# Patient Record
Sex: Female | Born: 1970 | Race: Asian | Hispanic: No | Marital: Married | Smoking: Never smoker
Health system: Southern US, Community
[De-identification: ages and names within clinical notes are randomized; demographics above are authoritative.]

---

## 2017-12-15 ENCOUNTER — Ambulatory Visit: Payer: 59 | Admitting: Family Medicine

## 2017-12-18 ENCOUNTER — Ambulatory Visit (INDEPENDENT_AMBULATORY_CARE_PROVIDER_SITE_OTHER): Payer: 59 | Admitting: Family Medicine

## 2017-12-18 ENCOUNTER — Encounter: Payer: Self-pay | Admitting: Family Medicine

## 2017-12-18 ENCOUNTER — Other Ambulatory Visit: Payer: Self-pay

## 2017-12-18 VITALS — BP 127/81 | HR 102 | Temp 98.5°F | Resp 16 | Ht 62.0 in | Wt 130.4 lb

## 2017-12-18 DIAGNOSIS — Z0000 Encounter for general adult medical examination without abnormal findings: Secondary | ICD-10-CM | POA: Diagnosis not present

## 2017-12-18 DIAGNOSIS — Z1329 Encounter for screening for other suspected endocrine disorder: Secondary | ICD-10-CM

## 2017-12-18 DIAGNOSIS — Z833 Family history of diabetes mellitus: Secondary | ICD-10-CM | POA: Diagnosis not present

## 2017-12-18 DIAGNOSIS — Z13220 Encounter for screening for lipoid disorders: Secondary | ICD-10-CM

## 2017-12-18 DIAGNOSIS — Z1239 Encounter for other screening for malignant neoplasm of breast: Secondary | ICD-10-CM

## 2017-12-18 DIAGNOSIS — Z1231 Encounter for screening mammogram for malignant neoplasm of breast: Secondary | ICD-10-CM

## 2017-12-18 DIAGNOSIS — Z131 Encounter for screening for diabetes mellitus: Secondary | ICD-10-CM

## 2017-12-18 DIAGNOSIS — Z780 Asymptomatic menopausal state: Secondary | ICD-10-CM

## 2017-12-18 NOTE — Progress Notes (Signed)
Chief Complaint  Patient presents with  . New Patient (Initial Visit)    establish care. No concerns per patient.    Subjective:  Deborah Herman is a 47 y.o. female here for a health maintenance visit.  Patient is new pt  Patient Active Problem List   Diagnosis Date Noted  . Menopause 12/18/2017    History reviewed. No pertinent past medical history.  History reviewed. No pertinent surgical history.   No outpatient medications prior to visit.   No facility-administered medications prior to visit.     No Known Allergies   Family History  Problem Relation Age of Onset  . Diabetes Father   . Heart disease Father      Health Habits: Dental Exam: up to date Eye Exam: not up to date Exercise:4 times/week on average Current exercise activities: walking Diet: balanced   Social History   Socioeconomic History  . Marital status: Married    Spouse name: Not on file  . Number of children: Not on file  . Years of education: Not on file  . Highest education level: Not on file  Occupational History  . Not on file  Social Needs  . Financial resource strain: Not on file  . Food insecurity:    Worry: Not on file    Inability: Not on file  . Transportation needs:    Medical: Not on file    Non-medical: Not on file  Tobacco Use  . Smoking status: Never Smoker  . Smokeless tobacco: Never Used  Substance and Sexual Activity  . Alcohol use: Never    Frequency: Never  . Drug use: Never  . Sexual activity: Never  Lifestyle  . Physical activity:    Days per week: Not on file    Minutes per session: Not on file  . Stress: Not on file  Relationships  . Social connections:    Talks on phone: Not on file    Gets together: Not on file    Attends religious service: Not on file    Active member of club or organization: Not on file    Attends meetings of clubs or organizations: Not on file    Relationship status: Not on file  . Intimate partner violence:    Fear of  current or ex partner: Not on file    Emotionally abused: Not on file    Physically abused: Not on file    Forced sexual activity: Not on file  Other Topics Concern  . Not on file  Social History Narrative  . Not on file   Social History   Substance and Sexual Activity  Alcohol Use Never  . Frequency: Never   Social History   Tobacco Use  Smoking Status Never Smoker  Smokeless Tobacco Never Used   Social History   Substance and Sexual Activity  Drug Use Never    GYN: Sexual Health Menstrual status: menopausal [id number] LMP: No LMP recorded. Patient is postmenopausal. Last pap smear: see HM section History of abnormal pap smears:  Sexually active: with female partner Current contraception: tubal ligation   Health Maintenance: See under health Maintenance activity for review of completion dates as well.  There is no immunization history on file for this patient.    Depression Screen-PHQ2/9 Depression screen [facility] 2/9 12/18/2017  Decreased Interest 0  Down, Depressed, Hopeless 0  PHQ - 2 Score 0       Depression Severity and Treatment Recommendations:  0-4= None  5-9= Mild / Treatment:  Support, educate to call if worse; return in one month  10-14= Moderate / Treatment: Support, watchful waiting; Antidepressant or Psycotherapy  15-19= Moderately severe / Treatment: Antidepressant OR Psychotherapy  >= 20 = Major depression, severe / Antidepressant AND Psychotherapy    Review of Systems   Review of Systems  Constitutional: Negative for chills, fever and weight loss.  HENT: Negative for congestion, ear discharge, ear pain, nosebleeds and tinnitus.   Eyes: Negative for blurred vision and double vision.  Respiratory: Negative for cough, shortness of breath and wheezing.   Cardiovascular: Negative for chest pain and palpitations.  Gastrointestinal: Negative for abdominal pain, diarrhea, nausea and vomiting.  Genitourinary: Negative for dysuria, frequency and  urgency.  Musculoskeletal: Negative for back pain, myalgias and neck pain.  Skin: Negative for itching and rash.  Neurological: Negative for dizziness, tingling, tremors and headaches.  Psychiatric/Behavioral: Negative for depression. The patient is not nervous/anxious and does not have insomnia.     See HPI for ROS as well.    Objective:   Vitals:   12/18/17 1111  BP: 127/81  Pulse: (!) 102  Resp: 16  Temp: 98.5 F (36.9 C)  TempSrc: Oral  SpO2: 97%  Weight: 130 lb 6.4 oz (59.1 kg)  Height: 5\' 2"  (1.575 m)    Body mass index is 23.85 kg/m.  Physical Exam  Constitutional: She is oriented to person, place, and time. She appears well-developed and well-nourished.  HENT:  Head: Normocephalic and atraumatic.  Eyes: Pupils are equal, round, and reactive to light. Conjunctivae and EOM are normal.  Normal fundoscopic exam  Neck: Normal range of motion. Neck supple.  Cardiovascular: Normal rate, regular rhythm and normal heart sounds.  No murmur heard. Pulmonary/Chest: Effort normal and breath sounds normal. No stridor. No respiratory distress. She has no wheezes.  Abdominal: Soft. Bowel sounds are normal. She exhibits no distension and no mass. There is no tenderness. There is no guarding.  Musculoskeletal: Normal range of motion. She exhibits no edema.  Neurological: She is alert and oriented to person, place, and time.  Skin: Skin is warm. Capillary refill takes less than 2 seconds.  Psychiatric: She has a normal mood and affect. Her behavior is normal. Judgment and thought content normal.       Assessment/Plan:   Patient was seen for a health maintenance exam.  Counseled the patient on health maintenance issues. Reviewed her health mainteance schedule and ordered appropriate tests (see orders.) Counseled on regular exercise and weight management. Recommend regular eye exams and dental cleaning.   The following issues were addressed today for health  maintenance:   Ziare was seen today for new patient (initial visit).  Diagnoses and all orders for this visit:  Encounter for health maintenance examination in adult- discussed diabetes screening, discussed lipid screening, discussed mammogram and bone density Patient will return for pap smear  Family history of diabetes mellitus (DM) -     Lipid panel; Future -     Hemoglobin A1c; Future -     Comprehensive metabolic panel; Future  Screening, lipid -  Discussed lipid screening   Screening for thyroid disorder -     TSH; Future  Screening for diabetes mellitus- family has history of diabetes -     Hemoglobin A1c; Future -     Comprehensive metabolic panel; Future  Menopause -     DG Bone Density; Future  Screening for breast cancer -     MM Digital Screening; Future    Return for  one month for pap smear and to review mammogram.    Body mass index is 23.85 kg/m.:  Discussed the patient's BMI with patient. The BMI body mass index is 23.85 kg/m.     Future Appointments  Date Time Provider Department Center  12/22/2017  8:00 AM PCP-NURSE PCP-PCP [facility]  01/19/2018  8:40 AM Doristine BosworthStallings, Yariela Tison A, MD PCP-PCP [facility]    Patient Instructions   We recommend that you schedule a mammogram for breast cancer screening. Typically, you do not need a referral to do this. Please contact a local imaging center to schedule your mammogram.  [facility] - [phone]  *ask for the Radiology Department [facility]) - [phone] or [phone]  [facility] - [phone] [facility] - [phone] [facility] - [phone]     IF you received an x-ray today, you will receive an invoice from [facility]. Please contact [facility] at [phone] with questions or concerns regarding your invoice.   IF you received labwork today, you will receive an invoice from [facility]. Please contact [facility] at  [phone] with questions or concerns regarding your invoice.   Our billing staff will not be able to assist you with questions regarding bills from these companies.  You will be contacted with the lab results as soon as they are available. The fastest way to get your results is to activate your My Chart account. Instructions are located on the last page of this paperwork. If you have not heard from us regarding the results in 2 weeks, please contact this office.     Bone Health Bones protect organs, store calcium, and anchor muscles. Good health habits, such as eating nutritious foods and exercising regularly, are important for maintaining healthy bones. They can also help to prevent a condition that causes bones to lose density and become weak and brittle (osteoporosis). Why is bone mass important? Bone mass refers to the amount of bone tissue that you have. The higher your bone mass, the stronger your bones. An important step toward having healthy bones throughout life is to have strong and dense bones during childhood. A young adult who has a high bone mass is more likely to have a high bone mass later in life. Bone mass at its greatest it is called peak bone mass. A large decline in bone mass occurs in older adults. In women, it occurs about the time of menopause. During this time, it is important to practice good health habits, because if more bone is lost than what is replaced, the bones will become less healthy and more likely to break (fracture). If you find that you have a low bone mass, you may be able to prevent osteoporosis or further bone loss by changing your diet and lifestyle. How can I find out if my bone mass is low? Bone mass can be measured with an X-ray test that is called a bone mineral density (BMD) test. This test is recommended for all women who are age 45 or older. It may also be recommended for men who are age 45 or older, or for people who are more likely to develop  osteoporosis due to:  Having bones that break easily.  Having a long-term disease that weakens bones, such as kidney disease or rheumatoid arthritis.  Having menopause earlier than normal.  Taking medicine that weakens bones, such as steroids, thyroid hormones, or hormone treatment for breast cancer or prostate cancer.  Smoking.  Drinking three or more alcoholic drinks each day.  What are the nutritional recommendations for healthy bones? To have healthy bones, you need to get enough of the right minerals and vitamins. Most nutrition experts recommend getting these nutrients from the foods that you eat. Nutritional recommendations vary from person to person. Ask your health care provider what is healthy for you. Here are some general guidelines. Calcium Recommendations Calcium is the most important (essential) mineral for bone health. Most people can get enough calcium from their diet, but supplements may be recommended for people who are at risk for osteoporosis. Good sources of calcium include:  Dairy products, such as low-fat or nonfat milk, cheese, and yogurt.  Dark green leafy vegetables, such as bok choy and broccoli.  Calcium-fortified foods, such as orange juice, cereal, bread, soy beverages, and tofu products.  Nuts, such as almonds.  Follow these recommended amounts for daily calcium intake:  Children, age 52?3: 700 mg.  Children, age 50?8: 1,000 mg.  Children, age 27?13: 1,300 mg.  Teens, age 524?18: 1,300 mg.  Adults, age 529?50: 1,000 mg.  Adults, age 37?70: ? Men: 1,000 mg. ? Women: 1,200 mg.  Adults, age 61 or older: 1,200 mg.  Pregnant and breastfeeding females: ? Teens: 1,300 mg. ? Adults: 1,000 mg.  Vitamin D Recommendations Vitamin D is the most essential vitamin for bone health. It helps the body to absorb calcium. Sunlight stimulates the skin to make vitamin D, so be sure to get enough sunlight. If you live in a cold climate or you do not get outside  often, your health care provider may recommend that you take vitamin D supplements. Good sources of vitamin D in your diet include:  Egg yolks.  Saltwater fish.  Milk and cereal fortified with vitamin D.  Follow these recommended amounts for daily vitamin D intake:  Children and teens, age 52?18: 600 international units.  Adults, age 32 or younger: 400-800 international units.  Adults, age 57 or older: 800-1,000 international units.  Other Nutrients Other nutrients for bone health include:  Phosphorus. This mineral is found in meat, poultry, dairy foods, nuts, and legumes. The recommended daily intake for adult men and adult women is 700 mg.  Magnesium. This mineral is found in seeds, nuts, dark green vegetables, and legumes. The recommended daily intake for adult men is 400?420 mg. For adult women, it is 310?320 mg.  Vitamin K. This vitamin is found in green leafy vegetables. The recommended daily intake is 120 mg for adult men and 90 mg for adult women.  What type of physical activity is best for building and maintaining healthy bones? Weight-bearing and strength-building activities are important for building and maintaining peak bone mass. Weight-bearing activities cause muscles and bones to work against gravity. Strength-building activities increases muscle strength that supports bones. Weight-bearing and muscle-building activities include:  Walking and hiking.  Jogging and running.  Dancing.  Gym exercises.  Lifting weights.  Tennis and racquetball.  Climbing stairs.  Aerobics.  Adults should get at least 30 minutes of moderate physical activity on most days. Children should get at least 60 minutes of moderate physical activity on most days. Ask your health care provide what type of exercise is best for you. Where can I find more information? For more information, check out the following websites:  National Osteoporosis Foundation:  http://burton-owens.org/  [employer] of [employer]: http://www.niams.http://www.johnson-fowler.biz/.asp  This information is not intended to replace advice given to you by your health care provider. Make sure you  discuss any questions you have with your health care provider. Document Released: 07/16/2003 Document Revised: 11/13/2015 Document Reviewed: 04/30/2014 Elsevier Interactive Patient Education  [employer]2018 Elsevier Inc.

## 2017-12-18 NOTE — Patient Instructions (Addendum)
We recommend that you schedule a mammogram for breast cancer screening. Typically, you do not need a referral to do this. Please contact a local imaging center to schedule your mammogram.  [facility] - [phone]  *ask for the Radiology Department [facility]) - [phone] or [phone]  [facility] - [phone] [facility] - [phone] [facility] - [phone]     IF you received an x-ray today, you will receive an invoice from [facility]. Please contact [facility] at [phone] with questions or concerns regarding your invoice.   IF you received labwork today, you will receive an invoice from [facility]. Please contact [facility] at [phone] with questions or concerns regarding your invoice.   Our billing staff will not be able to assist you with questions regarding bills from these companies.  You will be contacted with the lab results as soon as they are available. The fastest way to get your results is to activate your My Chart account. Instructions are located on the last page of this paperwork. If you have not heard from us regarding the results in 2 weeks, please contact this office.     Bone Health Bones protect organs, store calcium, and anchor muscles. Good health habits, such as eating nutritious foods and exercising regularly, are important for maintaining healthy bones. They can also help to prevent a condition that causes bones to lose density and become weak and brittle (osteoporosis). Why is bone mass important? Bone mass refers to the amount of bone tissue that you have. The higher your bone mass, the stronger your bones. An important step toward having healthy bones throughout life is to have strong and dense bones during childhood. A young adult who has a high bone mass is more likely to have a high bone mass later in life. Bone mass at its greatest it is called peak  bone mass. A large decline in bone mass occurs in older adults. In women, it occurs about the time of menopause. During this time, it is important to practice good health habits, because if more bone is lost than what is replaced, the bones will become less healthy and more likely to break (fracture). If you find that you have a low bone mass, you may be able to prevent osteoporosis or further bone loss by changing your diet and lifestyle. How can I find out if my bone mass is low? Bone mass can be measured with an X-ray test that is called a bone mineral density (BMD) test. This test is recommended for all women who are age 45 or older. It may also be recommended for men who are age 45 or older, or for people who are more likely to develop osteoporosis due to:  Having bones that break easily.  Having a long-term disease that weakens bones, such as kidney disease or rheumatoid arthritis.  Having menopause earlier than normal.  Taking medicine that weakens bones, such as steroids, thyroid hormones, or hormone treatment for breast cancer or prostate cancer.  Smoking.  Drinking three or more alcoholic drinks each day.  What are the nutritional recommendations for healthy bones? To have healthy bones, you need to get enough of the right minerals and vitamins. Most nutrition experts recommend getting these nutrients from the foods that you eat. Nutritional recommendations vary from person to person. Ask your health care provider what is healthy for you. Here are some general guidelines. Calcium Recommendations Calcium is  the most important (essential) mineral for bone health. Most people can get enough calcium from their diet, but supplements may be recommended for people who are at risk for osteoporosis. Good sources of calcium include:  Dairy products, such as low-fat or nonfat milk, cheese, and yogurt.  Dark green leafy vegetables, such as bok choy and broccoli.  Calcium-fortified foods, such  as orange juice, cereal, bread, soy beverages, and tofu products.  Nuts, such as almonds.  Follow these recommended amounts for daily calcium intake:  Children, age 26?3: 700 mg.  Children, age 70?8: 1,000 mg.  Children, age 80?13: 1,300 mg.  Teens, age 264?18: 1,300 mg.  Adults, age 269?50: 1,000 mg.  Adults, age 8?70: ? Men: 1,000 mg. ? Women: 1,200 mg.  Adults, age 26371 or older: 1,200 mg.  Pregnant and breastfeeding females: ? Teens: 1,300 mg. ? Adults: 1,000 mg.  Vitamin D Recommendations Vitamin D is the most essential vitamin for bone health. It helps the body to absorb calcium. Sunlight stimulates the skin to make vitamin D, so be sure to get enough sunlight. If you live in a cold climate or you do not get outside often, your health care provider may recommend that you take vitamin D supplements. Good sources of vitamin D in your diet include:  Egg yolks.  Saltwater fish.  Milk and cereal fortified with vitamin D.  Follow these recommended amounts for daily vitamin D intake:  Children and teens, age 26?18: 600 international units.  Adults, age 45 or younger: 400-800 international units.  Adults, age 70851 or older: 800-1,000 international units.  Other Nutrients Other nutrients for bone health include:  Phosphorus. This mineral is found in meat, poultry, dairy foods, nuts, and legumes. The recommended daily intake for adult men and adult women is 700 mg.  Magnesium. This mineral is found in seeds, nuts, dark green vegetables, and legumes. The recommended daily intake for adult men is 400?420 mg. For adult women, it is 310?320 mg.  Vitamin K. This vitamin is found in green leafy vegetables. The recommended daily intake is 120 mg for adult men and 90 mg for adult women.  What type of physical activity is best for building and maintaining healthy bones? Weight-bearing and strength-building activities are important for building and maintaining peak bone mass.  Weight-bearing activities cause muscles and bones to work against gravity. Strength-building activities increases muscle strength that supports bones. Weight-bearing and muscle-building activities include:  Walking and hiking.  Jogging and running.  Dancing.  Gym exercises.  Lifting weights.  Tennis and racquetball.  Climbing stairs.  Aerobics.  Adults should get at least 30 minutes of moderate physical activity on most days. Children should get at least 60 minutes of moderate physical activity on most days. Ask your health care provide what type of exercise is best for you. Where can I find more information? For more information, check out the following websites:  National Osteoporosis Foundation: http://burton-owens.org/http://nof.org/learn/basics  [employer] Health: http://www.niams.http://www.johnson-fowler.biz/nih.gov/Health_Info/Bone/Bone_Health/bone_health_for_life.asp  This information is not intended to replace advice given to you by your health care provider. Make sure you discuss any questions you have with your health care provider. Document Released: 07/16/2003 Document Revised: 11/13/2015 Document Reviewed: 04/30/2014 Elsevier Interactive Patient Education  [employer]2018 Elsevier Inc.

## 2017-12-22 ENCOUNTER — Ambulatory Visit (INDEPENDENT_AMBULATORY_CARE_PROVIDER_SITE_OTHER): Payer: 59 | Admitting: Emergency Medicine

## 2017-12-22 DIAGNOSIS — Z833 Family history of diabetes mellitus: Secondary | ICD-10-CM

## 2017-12-22 DIAGNOSIS — Z131 Encounter for screening for diabetes mellitus: Secondary | ICD-10-CM

## 2017-12-22 DIAGNOSIS — Z1329 Encounter for screening for other suspected endocrine disorder: Secondary | ICD-10-CM

## 2017-12-23 LAB — COMPREHENSIVE METABOLIC PANEL
A/G RATIO: 1.5 (ref 1.2–2.2)
ALBUMIN: 4.4 g/dL (ref 3.5–5.5)
ALT: 11 IU/L (ref 0–32)
AST: 14 IU/L (ref 0–40)
Alkaline Phosphatase: 58 IU/L (ref 39–117)
BILIRUBIN TOTAL: 0.2 mg/dL (ref 0.0–1.2)
BUN / CREAT RATIO: 17 (ref 9–23)
BUN: 12 mg/dL (ref 6–24)
CALCIUM: 9.8 mg/dL (ref 8.7–10.2)
CO2: 24 mmol/L (ref 20–29)
Chloride: 101 mmol/L (ref 96–106)
Creatinine, Ser: 0.7 mg/dL (ref 0.57–1.00)
GFR calc Af Amer: 121 mL/min/{1.73_m2} (ref >59)
GFR, EST NON AFRICAN AMERICAN: 105 mL/min/{1.73_m2} (ref >59)
GLOBULIN, TOTAL: 3 g/dL (ref 1.5–4.5)
Glucose: 90 mg/dL (ref 65–99)
POTASSIUM: 4 mmol/L (ref 3.5–5.2)
SODIUM: 140 mmol/L (ref 134–144)
TOTAL PROTEIN: 7.4 g/dL (ref 6.0–8.5)

## 2017-12-23 LAB — LIPID PANEL
CHOLESTEROL TOTAL: 190 mg/dL (ref 100–199)
Chol/HDL Ratio: 2.9 ratio (ref 0.0–4.4)
HDL: 65 mg/dL (ref >39)
LDL Calculated: 113 mg/dL — ABNORMAL HIGH (ref 0–99)
Triglycerides: 58 mg/dL (ref 0–149)
VLDL CHOLESTEROL CAL: 12 mg/dL (ref 5–40)

## 2017-12-23 LAB — HEMOGLOBIN A1C
Est. average glucose Bld gHb Est-mCnc: 114 mg/dL
Hgb A1c MFr Bld: 5.6 % (ref 4.8–5.6)

## 2017-12-23 LAB — TSH: TSH: 3.19 u[IU]/mL (ref 0.450–4.500)

## 2017-12-26 ENCOUNTER — Ambulatory Visit (HOSPITAL_BASED_OUTPATIENT_CLINIC_OR_DEPARTMENT_OTHER)
Admission: RE | Admit: 2017-12-26 | Discharge: 2017-12-26 | Disposition: A | Discharge status: Home / Self Care | Payer: 59 | Source: Ambulatory Visit | Attending: Family Medicine | Admitting: Family Medicine

## 2017-12-26 DIAGNOSIS — Z1231 Encounter for screening mammogram for malignant neoplasm of breast: Secondary | ICD-10-CM | POA: Diagnosis present

## 2017-12-26 DIAGNOSIS — Z780 Asymptomatic menopausal state: Secondary | ICD-10-CM | POA: Insufficient documentation

## 2017-12-26 DIAGNOSIS — Z1239 Encounter for other screening for malignant neoplasm of breast: Secondary | ICD-10-CM

## 2017-12-26 DIAGNOSIS — M85852 Other specified disorders of bone density and structure, left thigh: Secondary | ICD-10-CM | POA: Diagnosis not present

## 2017-12-26 DIAGNOSIS — M8589 Other specified disorders of bone density and structure, multiple sites: Secondary | ICD-10-CM | POA: Diagnosis not present

## 2018-01-19 ENCOUNTER — Ambulatory Visit: Payer: 59 | Admitting: Family Medicine

## 2019-07-03 IMAGING — MG DIGITAL SCREENING BILATERAL MAMMOGRAM WITH TOMO AND CAD
8 series · 9 of 24 positions shown · non-contrast
Comparison: None.

CLINICAL DATA: Screening.

EXAM:
DIGITAL SCREENING BILATERAL MAMMOGRAM WITH TOMO AND CAD

[R CC synth-2D]
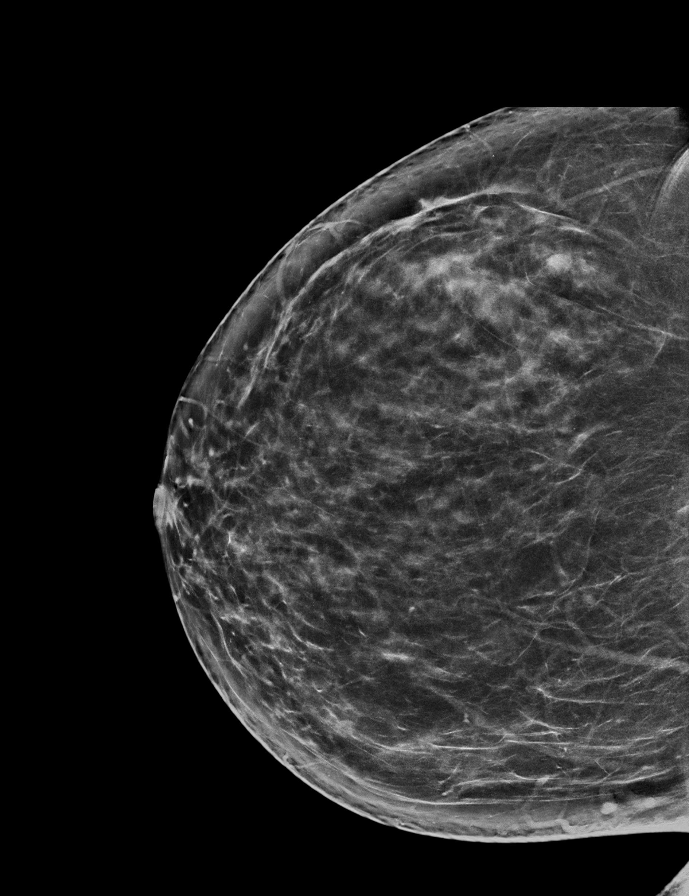

[L MLO synth-2D]
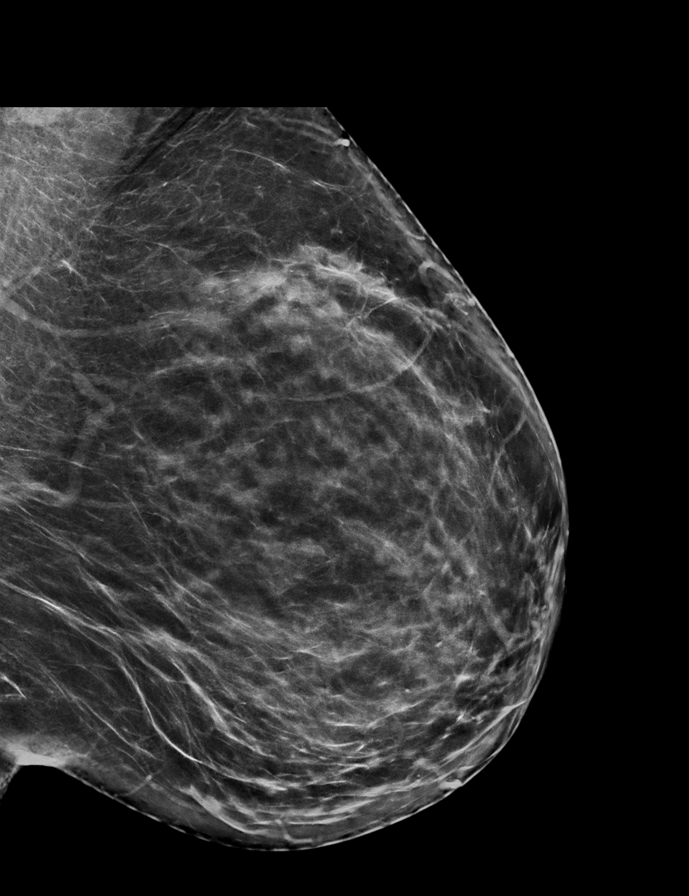

[L CC synth-2D]
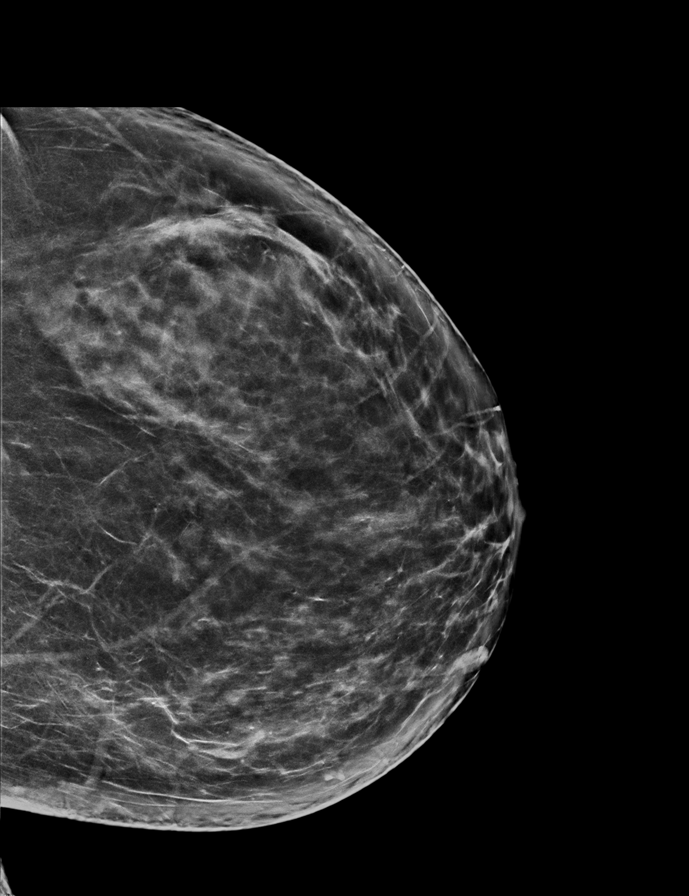

[R MLO synth-2D]
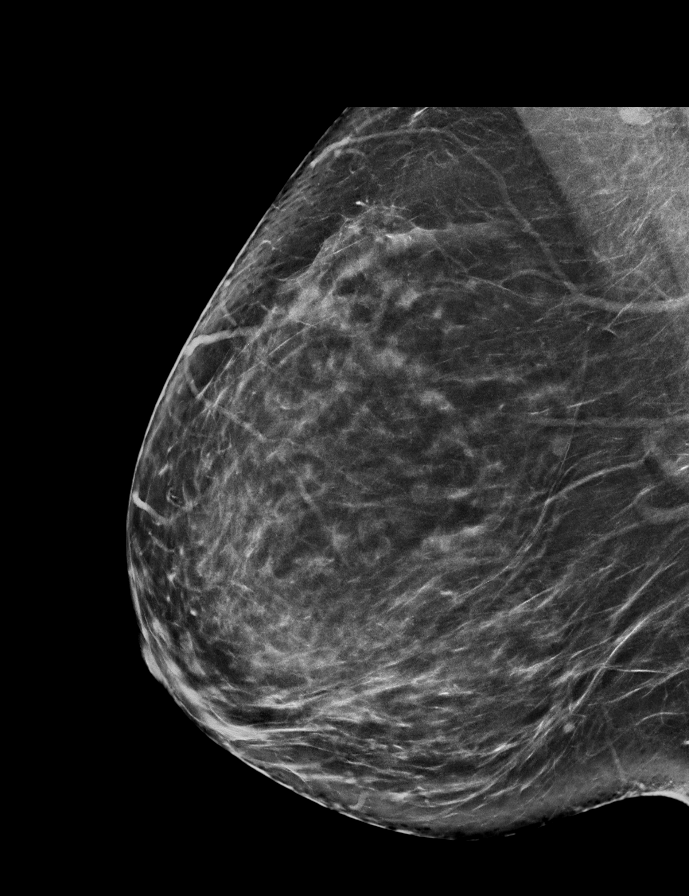

[L CC tomo · 2 of 74 frames shown]
[frame 24/74]
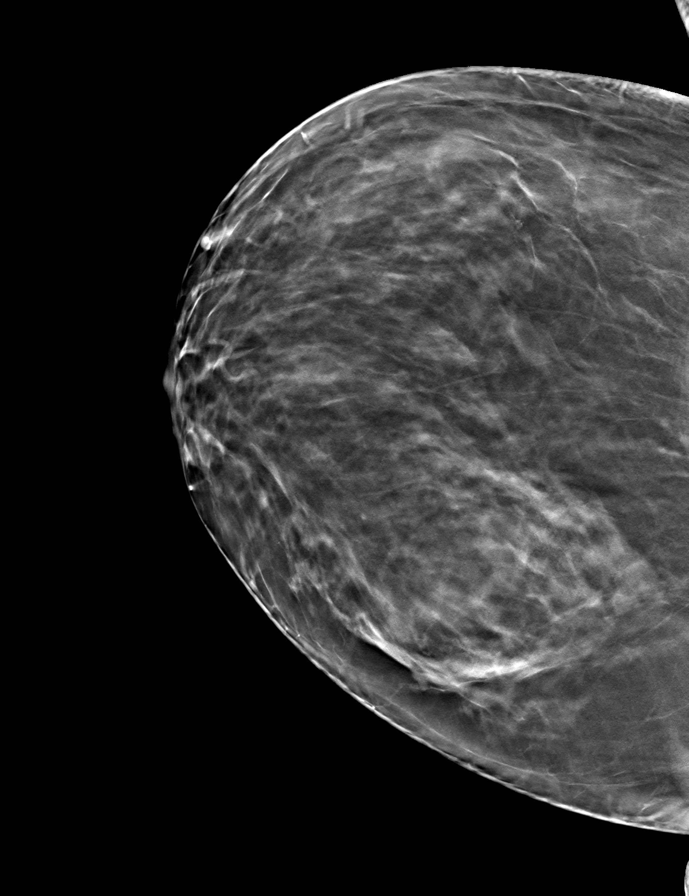
[frame 37/74]
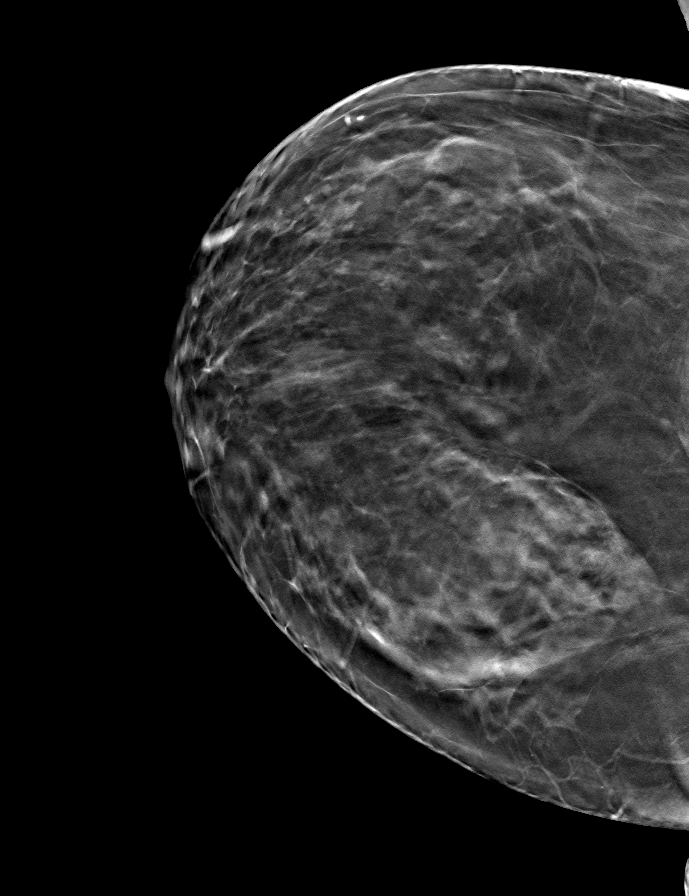

[L MLO tomo · tomo slice 39/76.0]
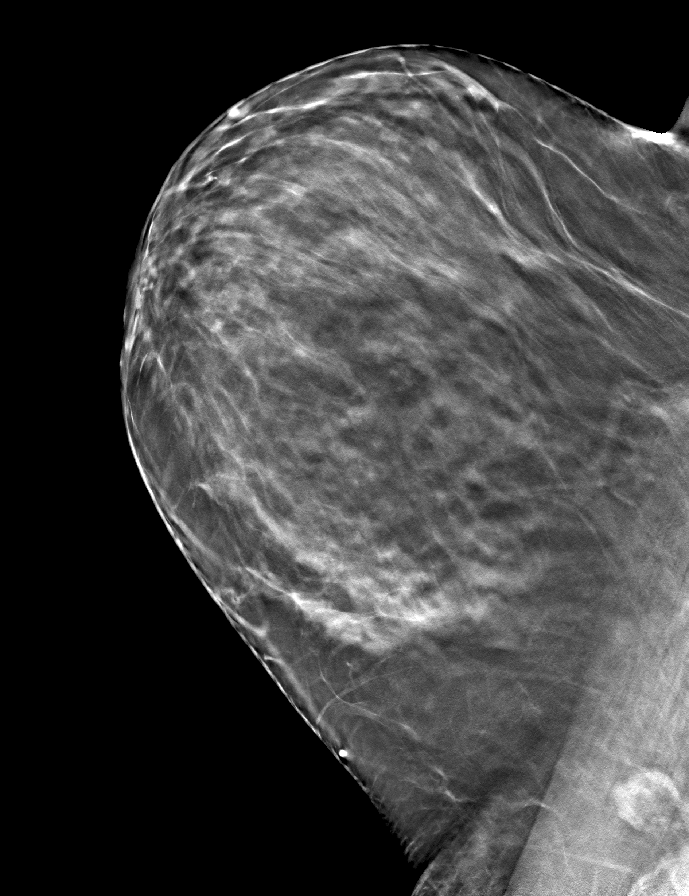

[R MLO tomo · tomo slice 39/76.0]
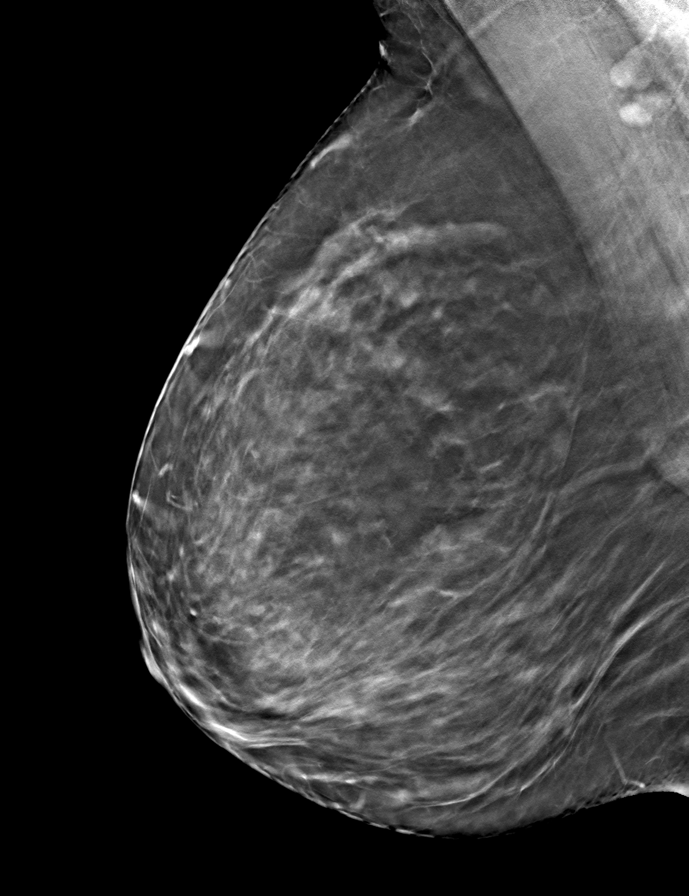

[R CC tomo · tomo slice 37/73.0]
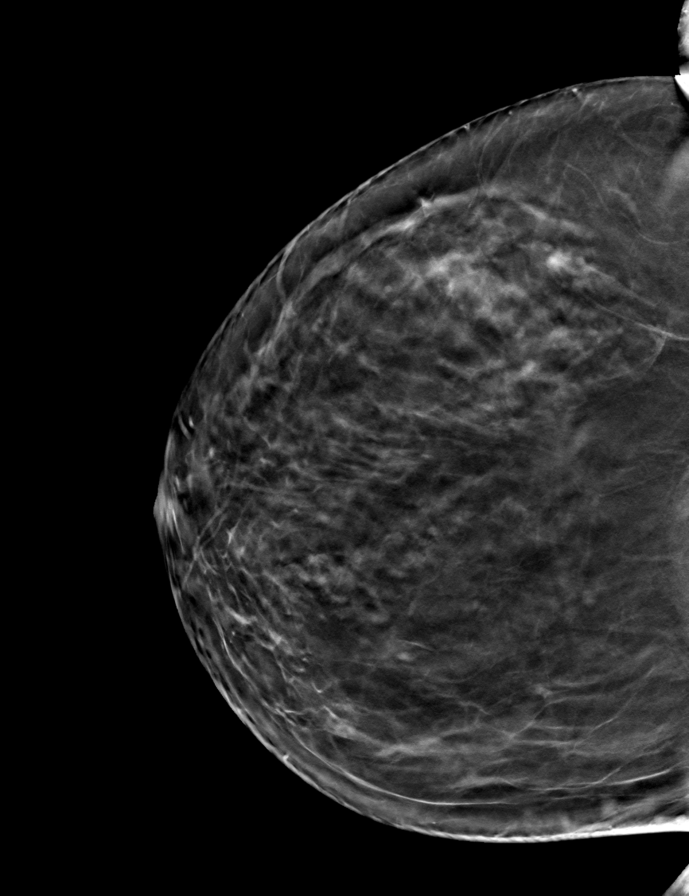

[9 of 24 positions shown; findings below may reference images not displayed]

ACR Breast Density Category b: There are scattered areas of
fibroglandular density.
FINDINGS: There are no findings suspicious for malignancy. Images were
processed with CAD.
IMPRESSION: No mammographic evidence of malignancy. A result letter of this
screening mammogram will be mailed directly to the patient.

RECOMMENDATION:
Screening mammogram in one year. (Code:Y5-G-EJ6)

BI-RADS CATEGORY  1: Negative.
